# Patient Record
Sex: Female | Born: 1995 | Race: White | Hispanic: No | Marital: Single | Smoking: Never smoker
Health system: Southern US, Community
[De-identification: ages and names within clinical notes are randomized; demographics above are authoritative.]

## PROBLEM LIST (undated history)

## (undated) DIAGNOSIS — Z789 Other specified health status: Secondary | ICD-10-CM

## (undated) HISTORY — DX: Other specified health status: Z78.9

---
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
## 2016-08-08 ENCOUNTER — Ambulatory Visit (INDEPENDENT_AMBULATORY_CARE_PROVIDER_SITE_OTHER): Payer: BLUE CROSS/BLUE SHIELD | Admitting: Family Medicine

## 2016-08-08 VITALS — BP 109/69 | HR 74 | Temp 98.0°F | Resp 18 | Ht 66.0 in | Wt 142.1 lb

## 2016-08-08 DIAGNOSIS — L259 Unspecified contact dermatitis, unspecified cause: Secondary | ICD-10-CM | POA: Diagnosis not present

## 2016-08-08 DIAGNOSIS — B354 Tinea corporis: Secondary | ICD-10-CM

## 2016-08-08 MED ORDER — CLOTRIMAZOLE-BETAMETHASONE 1-0.05 % EX CREA: 1.0000 "application " | TOPICAL_CREAM | Freq: Two times a day (BID) | CUTANEOUS | 0 refills | Status: AC

## 2016-08-08 NOTE — Progress Notes (Signed)
Subjective:  By signing my name below, I, Janet Baker, attest that this documentation has been prepared under the direction and in the presence of Shade Flood, MD Electronically Signed: Charline Bills, ED Scribe 08/08/2016 at 12:40 PM.   Patient ID: Janet Baker, female    DOB: June 01, 1995, 21 y.o.   MRN: [id number]  Chief Complaint  Patient presents with  . Recurrent Skin Infections    possible ringworm on back of right leg (behind knee)-noticed about 4-5 weeks ago (may be getting bigger & spreading). No itching, just red   HPI Janet Baker is a 21 y.o. female who presents to Primary Care at [facility] complaining of a gradually spreading non-pruritic, nonpainful red rash to the back of her right leg first noticed approximately 4-5 weeks ago. Pt states that the rash has gradually spread to the left leg as well. No treatments tried PTA. She denies fever, night sweats and any other symptoms. 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   There are no active problems to display for this patient.  No past medical history on file. No past surgical history on file. No Known Allergies Prior to Admission medications   Not on File   Social History   Social History  . Marital status: Single    Spouse name: N/A  . Number of children: N/A  . Years of education: N/A   Occupational History  . Crew member [employer]   Social History Main Topics  . Smoking status: Never Smoker  . Smokeless tobacco: Never Used  . Alcohol use No  . Drug use: Unknown  . Sexual activity: No   Other Topics Concern  . Not on file   Social History Narrative   [employer] beginning in fall 2016 - undecided major.   Review of Systems  Constitutional: Negative for diaphoresis and fever.  Skin: Positive for rash.     Objective:   Physical Exam  Constitutional: She is oriented to person, place, and time. She appears well-developed and well-nourished. No distress.  HENT:   Head: Normocephalic and atraumatic.  Eyes: Conjunctivae and EOM are normal.  Neck: Neck supple. No tracheal deviation present.  Cardiovascular: Normal rate.   Pulmonary/Chest: Effort normal. No respiratory distress.  Musculoskeletal: Normal range of motion.  Neurological: She is alert and oriented to person, place, and time.  Skin: Skin is warm and dry. Rash noted.  L wrist: few scatted erythematous papules on volar aspect of wrist.  R LE: patch with slight central clearing dry erythematous appearing peripherally measuring 3 cm across. Small 1 cm patch proximal. ~1 cm patch on posterior R upper calf.  L LE: 2 small 1 cm patches. Very faint patch anterior L LE 2 cm across.   Psychiatric: She has a normal mood and affect. Her behavior is normal.  Nursing note and vitals reviewed.  Vitals:   08/08/16 1149  BP: 109/69  Pulse: 74  Resp: 18  Temp: 98 F (36.7 C)  TempSrc: Oral  SpO2: 99%  Weight: 142 lb 2 oz (64.5 kg)  Height:  (1.676 m)      Assessment & Plan:    BRINLEIGH TEW is a 21 y.o. female Contact dermatitis, unspecified contact dermatitis type, unspecified trigger  - Probable reaction to metal on watch for wrist. Avoid use of watch temporarily, or cover metal. Over-the-counter cortisone cream if needed.  Tinea corporis   - Few patches on lower legs, largest patch does have  some central clearing aspect that may be tinea corporis. Less likely granuloma annulare, or vasculitic.   -Trial of Lotrisone cream twice a day, if there is clearing, use for an additional 1 week.  - If rashes continue or persistent spread - recheck here or other medical provider.    Meds ordered this encounter  Medications  . clotrimazole-betamethasone (LOTRISONE) cream    Sig: Apply 1 application topically 2 (two) times daily.    Dispense:  45 g    Refill:  0   Patient Instructions   The rash on your wrist appears to be due to contact dermatitis, likely from the watch. You can try  avoiding use of watch temporarily and cortisone cream if needed. That can be purchased over-the-counter.  Rash on your legs may be due to ringworm or another type of contact dermatitis. Try the Lotrisone cream twice per day. If the symptoms are improving, continue to use that cream for time needed for rash to resolve, then for 1 additional week. If not improving the next 2-3 weeks or continuing to spread, recheck here or other medical provider.  Return to the clinic or go to the nearest emergency room if any of your symptoms worsen or new symptoms occur.   Body Ringworm Body ringworm is an infection of the skin that often causes a ring-shaped rash. Body ringworm can affect any part of your skin. It can spread easily to others. Body ringworm is also called tinea corporis. What are the causes? This condition is caused by funguses called dermatophytes. The condition develops when these funguses grow out of control on the skin. You can get this condition if you touch a person or animal that has it. You can also get it if you share clothing, bedding, towels, or any other object with an infected person or pet. What increases the risk? This condition is more likely to develop in:  Athletes who often make skin-to-skin contact with other athletes, such as wrestlers.  People who share equipment and mats.  People with a weakened immune system. What are the signs or symptoms? Symptoms of this condition include:  Itchy, raised red spots and bumps.  Red scaly patches.  A ring-shaped rash. The rash may have:  A clear center.  Scales or red bumps at its center.  Redness near its borders.  Dry and scaly skin on or around it. How is this diagnosed? This condition can usually be diagnosed with a skin exam. A skin scraping may be taken from the affected area and examined under a microscope to see if the fungus is present. How is this treated? This condition may be treated with:  An antifungal  cream or ointment.  An antifungal shampoo.  Antifungal medicines. These may be prescribed if your ringworm is severe, keeps coming back, or lasts a long time. Follow these instructions at home:  Take over-the-counter and prescription medicines only as told by your health care provider.  If you were given an antifungal cream or ointment:  Use it as told by your health care provider.  Wash the infected area and dry it completely before applying the cream or ointment.  If you were given an antifungal shampoo:  Use it as told by your health care provider.  Leave the shampoo on your body for 3-5 minutes before rinsing.  While you have a rash:  Wear loose clothing to stop clothes from rubbing and irritating it.  Wash or change your bed sheets every night.  If your  pet has the same infection, take your pet to see a International aid/development worker. How is this prevented?  Practice good hygiene.  Wear sandals or shoes in public places and showers.  Do not share personal items with others.  Avoid touching red patches of skin on other people.  Avoid touching pets that have bald spots.  If you touch an animal that has a bald spot, wash your hands. Contact a health care provider if:  Your rash continues to spread after 7 days of treatment.  Your rash is not gone in 4 weeks.  The area around your rash gets red, warm, tender, and swollen. This information is not intended to replace advice given to you by your health care provider. Make sure you discuss any questions you have with your health care provider. Document Released: 04/24/2000 Document Revised: 10/03/2015 Document Reviewed: 02/21/2015 Elsevier Interactive Patient Education  2017 Elsevier Inc.  Contact Dermatitis Dermatitis is redness, soreness, and swelling (inflammation) of the skin. Contact dermatitis is a reaction to certain substances that touch the skin. There are two types of contact dermatitis:  Irritant contact dermatitis. This  type is caused by something that irritates your skin, such as dry hands from washing them too much. This type does not require previous exposure to the substance for a reaction to occur. This type is more common.  Allergic contact dermatitis. This type is caused by a substance that you are allergic to, such as a nickel allergy or poison ivy. This type only occurs if you have been exposed to the substance (allergen) before. Upon a repeat exposure, your body reacts to the substance. This type is less common. What are the causes? Many different substances can cause contact dermatitis. Irritant contact dermatitis is most commonly caused by exposure to:  Makeup.  Soaps.  Detergents.  Bleaches.  Acids.  Metal salts, such as nickel. Allergic contact dermatitis is most commonly caused by exposure to:  Poisonous plants.  Chemicals.  Jewelry.  Latex.  Medicines.  Preservatives in products, such as clothing. What increases the risk? This condition is more likely to develop in:  People who have jobs that expose them to irritants or allergens.  People who have certain medical conditions, such as asthma or eczema. What are the signs or symptoms? Symptoms of this condition may occur anywhere on your body where the irritant has touched you or is touched by you. Symptoms include:  Dryness or flaking.  Redness.  Cracks.  Itching.  Pain or a burning feeling.  Blisters.  Drainage of small amounts of blood or clear fluid from skin cracks. With allergic contact dermatitis, there may also be swelling in areas such as the eyelids, mouth, or genitals. How is this diagnosed? This condition is diagnosed with a medical history and physical exam. A patch skin test may be performed to help determine the cause. If the condition is related to your job, you may need to see an occupational medicine specialist. How is this treated? Treatment for this condition includes figuring out what caused  the reaction and protecting your skin from further contact. Treatment may also include:  Steroid creams or ointments. Oral steroid medicines may be needed in more severe cases.  Antibiotics or antibacterial ointments, if a skin infection is present.  Antihistamine lotion or an antihistamine taken by mouth to ease itching.  A bandage (dressing). Follow these instructions at home: Skin Care   Moisturize your skin as needed.  Apply cool compresses to the affected areas.  Try taking  a bath with:  Epsom salts. Follow the instructions on the packaging. You can get these at your local pharmacy or grocery store.  Baking soda. Pour a small amount into the bath as directed by your health care provider.  Colloidal oatmeal. Follow the instructions on the packaging. You can get this at your local pharmacy or grocery store.  Try applying baking soda paste to your skin. Stir water into baking soda until it reaches a paste-like consistency.  Do not scratch your skin.  Bathe less frequently, such as every other day.  Bathe in lukewarm water. Avoid using hot water. Medicines   Take or apply over-the-counter and prescription medicines only as told by your health care provider.  If you were prescribed an antibiotic medicine, take or apply your antibiotic as told by your health care provider. Do not stop using the antibiotic even if your condition starts to improve. General instructions   Keep all follow-up visits as told by your health care provider. This is important.  Avoid the substance that caused your reaction. If you do not know what caused it, keep a journal to try to track what caused it. Write down:  What you eat.  What cosmetic products you use.  What you drink.  What you wear in the affected area. This includes jewelry.  If you were given a dressing, take care of it as told by your health care provider. This includes when to change and remove it. Contact a health care  provider if:  Your condition does not improve with treatment.  Your condition gets worse.  You have signs of infection such as swelling, tenderness, redness, soreness, or warmth in the affected area.  You have a fever.  You have new symptoms. Get help right away if:  You have a severe headache, neck pain, or neck stiffness.  You vomit.  You feel very sleepy.  You notice red streaks coming from the affected area.  Your bone or joint underneath the affected area becomes painful after the skin has healed.  The affected area turns darker.  You have difficulty breathing. This information is not intended to replace advice given to you by your health care provider. Make sure you discuss any questions you have with your health care provider. Document Released: 04/24/2000 Document Revised: 10/03/2015 Document Reviewed: 09/12/2014 Elsevier Interactive Patient Education  2017 [employer].   IF you received an x-ray today, you will receive an invoice from [facility]. Please contact [facility] at [phone] with questions or concerns regarding your invoice.   IF you received labwork today, you will receive an invoice from [place]. Please contact [facility] at [phone] with questions or concerns regarding your invoice.   Our billing staff will not be able to assist you with questions regarding bills from these companies.  You will be contacted with the lab results as soon as they are available. The fastest way to get your results is to activate your My Chart account. Instructions are located on the last page of this paperwork. If you have not heard from [place] regarding the results in 2 weeks, please contact this office.       I personally performed the services described in this documentation, which was scribed in my presence. The recorded information has been reviewed and considered for accuracy and completeness, addended by me as needed, and agree with  information above.  Signed,   Meredith Staggers, MD Primary Care at [facility].  08/08/16 1:44 PM

## 2016-08-08 NOTE — Patient Instructions (Addendum)
The rash on your wrist appears to be due to contact dermatitis, likely from the watch. You can try avoiding use of watch temporarily and cortisone cream if needed. That can be purchased over-the-counter.  Rash on your legs may be due to ringworm or another type of contact dermatitis. Try the Lotrisone cream twice per day. If the symptoms are improving, continue to use that cream for time needed for rash to resolve, then for 1 additional week. If not improving the next 2-3 weeks or continuing to spread, recheck here or other medical provider.  Return to the clinic or go to the nearest emergency room if any of your symptoms worsen or new symptoms occur.   Body Ringworm Body ringworm is an infection of the skin that often causes a ring-shaped rash. Body ringworm can affect any part of your skin. It can spread easily to others. Body ringworm is also called tinea corporis. What are the causes? This condition is caused by funguses called dermatophytes. The condition develops when these funguses grow out of control on the skin. You can get this condition if you touch a person or animal that has it. You can also get it if you share clothing, bedding, towels, or any other object with an infected person or pet. What increases the risk? This condition is more likely to develop in:  Athletes who often make skin-to-skin contact with other athletes, such as wrestlers.  People who share equipment and mats.  People with a weakened immune system. What are the signs or symptoms? Symptoms of this condition include:  Itchy, raised red spots and bumps.  Red scaly patches.  A ring-shaped rash. The rash may have:  A clear center.  Scales or red bumps at its center.  Redness near its borders.  Dry and scaly skin on or around it. How is this diagnosed? This condition can usually be diagnosed with a skin exam. A skin scraping may be taken from the affected area and examined under a microscope to see if  the fungus is present. How is this treated? This condition may be treated with:  An antifungal cream or ointment.  An antifungal shampoo.  Antifungal medicines. These may be prescribed if your ringworm is severe, keeps coming back, or lasts a long time. Follow these instructions at home:  Take over-the-counter and prescription medicines only as told by your health care provider.  If you were given an antifungal cream or ointment:  Use it as told by your health care provider.  Wash the infected area and dry it completely before applying the cream or ointment.  If you were given an antifungal shampoo:  Use it as told by your health care provider.  Leave the shampoo on your body for 3-5 minutes before rinsing.  While you have a rash:  Wear loose clothing to stop clothes from rubbing and irritating it.  Wash or change your bed sheets every night.  If your pet has the same infection, take your pet to see a International aid/development worker. How is this prevented?  Practice good hygiene.  Wear sandals or shoes in public places and showers.  Do not share personal items with others.  Avoid touching red patches of skin on other people.  Avoid touching pets that have bald spots.  If you touch an animal that has a bald spot, wash your hands. Contact a health care provider if:  Your rash continues to spread after 7 days of treatment.  Your rash is not gone in 4  weeks.  The area around your rash gets red, warm, tender, and swollen. This information is not intended to replace advice given to you by your health care provider. Make sure you discuss any questions you have with your health care provider. Document Released: 04/24/2000 Document Revised: 10/03/2015 Document Reviewed: 02/21/2015 Elsevier Interactive Patient Education  2017 Elsevier Inc.  Contact Dermatitis Dermatitis is redness, soreness, and swelling (inflammation) of the skin. Contact dermatitis is a reaction to certain substances  that touch the skin. There are two types of contact dermatitis:  Irritant contact dermatitis. This type is caused by something that irritates your skin, such as dry hands from washing them too much. This type does not require previous exposure to the substance for a reaction to occur. This type is more common.  Allergic contact dermatitis. This type is caused by a substance that you are allergic to, such as a nickel allergy or poison ivy. This type only occurs if you have been exposed to the substance (allergen) before. Upon a repeat exposure, your body reacts to the substance. This type is less common. What are the causes? Many different substances can cause contact dermatitis. Irritant contact dermatitis is most commonly caused by exposure to:  Makeup.  Soaps.  Detergents.  Bleaches.  Acids.  Metal salts, such as nickel. Allergic contact dermatitis is most commonly caused by exposure to:  Poisonous plants.  Chemicals.  Jewelry.  Latex.  Medicines.  Preservatives in products, such as clothing. What increases the risk? This condition is more likely to develop in:  People who have jobs that expose them to irritants or allergens.  People who have certain medical conditions, such as asthma or eczema. What are the signs or symptoms? Symptoms of this condition may occur anywhere on your body where the irritant has touched you or is touched by you. Symptoms include:  Dryness or flaking.  Redness.  Cracks.  Itching.  Pain or a burning feeling.  Blisters.  Drainage of small amounts of blood or clear fluid from skin cracks. With allergic contact dermatitis, there may also be swelling in areas such as the eyelids, mouth, or genitals. How is this diagnosed? This condition is diagnosed with a medical history and physical exam. A patch skin test may be performed to help determine the cause. If the condition is related to your job, you may need to see an occupational  medicine specialist. How is this treated? Treatment for this condition includes figuring out what caused the reaction and protecting your skin from further contact. Treatment may also include:  Steroid creams or ointments. Oral steroid medicines may be needed in more severe cases.  Antibiotics or antibacterial ointments, if a skin infection is present.  Antihistamine lotion or an antihistamine taken by mouth to ease itching.  A bandage (dressing). Follow these instructions at home: Skin Care   Moisturize your skin as needed.  Apply cool compresses to the affected areas.  Try taking a bath with:  Epsom salts. Follow the instructions on the packaging. You can get these at your local pharmacy or grocery store.  Baking soda. Pour a small amount into the bath as directed by your health care provider.  Colloidal oatmeal. Follow the instructions on the packaging. You can get this at your local pharmacy or grocery store.  Try applying baking soda paste to your skin. Stir water into baking soda until it reaches a paste-like consistency.  Do not scratch your skin.  Bathe less frequently, such as every other day.  Bathe in lukewarm water. Avoid using hot water. Medicines   Take or apply over-the-counter and prescription medicines only as told by your health care provider.  If you were prescribed an antibiotic medicine, take or apply your antibiotic as told by your health care provider. Do not stop using the antibiotic even if your condition starts to improve. General instructions   Keep all follow-up visits as told by your health care provider. This is important.  Avoid the substance that caused your reaction. If you do not know what caused it, keep a journal to try to track what caused it. Write down:  What you eat.  What cosmetic products you use.  What you drink.  What you wear in the affected area. This includes jewelry.  If you were given a dressing, take care of it as  told by your health care provider. This includes when to change and remove it. Contact a health care provider if:  Your condition does not improve with treatment.  Your condition gets worse.  You have signs of infection such as swelling, tenderness, redness, soreness, or warmth in the affected area.  You have a fever.  You have new symptoms. Get help right away if:  You have a severe headache, neck pain, or neck stiffness.  You vomit.  You feel very sleepy.  You notice red streaks coming from the affected area.  Your bone or joint underneath the affected area becomes painful after the skin has healed.  The affected area turns darker.  You have difficulty breathing. This information is not intended to replace advice given to you by your health care provider. Make sure you discuss any questions you have with your health care provider. Document Released: 04/24/2000 Document Revised: 10/03/2015 Document Reviewed: 09/12/2014 Elsevier Interactive Patient Education  2017 [employer].   IF you received an x-ray today, you will receive an invoice from [facility]. Please contact [facility] at [phone] with questions or concerns regarding your invoice.   IF you received labwork today, you will receive an invoice from [place]. Please contact [facility] at [phone] with questions or concerns regarding your invoice.   Our billing staff will not be able to assist you with questions regarding bills from these companies.  You will be contacted with the lab results as soon as they are available. The fastest way to get your results is to activate your My Chart account. Instructions are located on the last page of this paperwork. If you have not heard from [place] regarding the results in 2 weeks, please contact this office.

## 2016-10-30 ENCOUNTER — Encounter: Payer: Self-pay | Admitting: Family Medicine

## 2016-10-30 ENCOUNTER — Ambulatory Visit (INDEPENDENT_AMBULATORY_CARE_PROVIDER_SITE_OTHER): Payer: BLUE CROSS/BLUE SHIELD | Admitting: Family Medicine

## 2016-10-30 VITALS — BP 107/70 | HR 83 | Temp 98.0°F | Resp 18 | Ht 65.75 in | Wt 145.0 lb

## 2016-10-30 DIAGNOSIS — Z23 Encounter for immunization: Secondary | ICD-10-CM | POA: Diagnosis not present

## 2016-10-30 DIAGNOSIS — Z7189 Other specified counseling: Secondary | ICD-10-CM

## 2016-10-30 DIAGNOSIS — Z7689 Persons encountering health services in other specified circumstances: Secondary | ICD-10-CM | POA: Diagnosis not present

## 2016-10-30 DIAGNOSIS — Z7184 Encounter for health counseling related to travel: Secondary | ICD-10-CM

## 2016-10-30 MED ORDER — MENINGOCOCCAL VAC B (OMV) IM SUSY: 0.5000 mL | PREFILLED_SYRINGE | Freq: Once | INTRAMUSCULAR | 0 refills | Status: AC

## 2016-10-30 NOTE — Progress Notes (Signed)
Subjective:  By signing my name below, I, Janet Baker, attest that this documentation has been prepared under the direction and in the presence of Wendie Agreste, MD Electronically Signed: Ladene Artist, ED Scribe 10/30/2016 at 10:29 AM.   Patient ID: Janet Baker, female    DOB: 06-06-1995, 21 y.o.   MRN: [id number]  Chief Complaint  Patient presents with  . Immunizations   HPI Janet Baker is a 21 y.o. female who presents to Primary Care at [facility] for immunization review. Most recent Tdap May 2016; requests booster. Pt states that she called and was told that she needed to come in to have a booster done since her last Tdap was in 2007. She is unsure who she spoke with on the phone. Pt also requests the Meningitis B vaccine at this visit since she plans to travel to [place] for 3 months in September for school. Her pediatrician recommended that she get the vaccine since Meningitis B originated in [place]. Pt lives in an apartment with roommates.   Immunization History  Administered Date(s) Administered  . DTaP 02/02/1997, 04/04/1997, 06/04/1997, 06/18/1998, 12/03/2000  . Hepatitis A 12/07/2005, 12/08/2006  . Hepatitis B Jun 09, 1995, 01/02/1997, 09/03/1997  . HiB (PRP-OMP) 02/02/1997, 04/04/1997, 01/23/1998  . IPV 02/02/1997, 04/04/1997, 06/04/1997, 12/03/2000  . Influenza-Unspecified 03/14/2009  . MMR 01/23/1998, 12/03/2000  . Meningococcal Conjugate 09/22/2013  . Meningococcal Polysaccharide 01/08/2009  . Td 11/26/2005  . Tdap 09/11/2014  . Varicella 01/23/1998, 12/07/2005   There are no active problems to display for this patient.  History reviewed. No pertinent past medical history. History reviewed. No pertinent surgical history. No Known Allergies Prior to Admission medications   Medication Sig Start Date End Date Taking? Authorizing Provider  clotrimazole-betamethasone (LOTRISONE) cream Apply 1 application topically 2 (two) times daily. 08/08/16   Wendie Agreste,  MD   Social History   Social History  . Marital status: Single    Spouse name: N/A  . Number of children: N/A  . Years of education: N/A   Occupational History  . Crew member [employer]   Social History Main Topics  . Smoking status: Never Smoker  . Smokeless tobacco: Never Used  . Alcohol use No  . Drug use: Unknown  . Sexual activity: No   Other Topics Concern  . Not on file   Social History Narrative   [employer] beginning in fall 2016 - undecided major.   Review of Systems  Constitutional: Negative for fever.      Objective:   Physical Exam  Constitutional: She is oriented to person, place, and time. She appears well-developed and well-nourished. No distress.  HENT:  Head: Normocephalic and atraumatic.  Eyes: Conjunctivae and EOM are normal.  Neck: Neck supple. No tracheal deviation present.  Cardiovascular: Normal rate.   Pulmonary/Chest: Effort normal. No respiratory distress.  Musculoskeletal: Normal range of motion.  Neurological: She is alert and oriented to person, place, and time.  Skin: Skin is warm and dry.  Psychiatric: She has a normal mood and affect. Her behavior is normal.  Nursing note and vitals reviewed.  Vitals:   10/30/16 1022  BP: 107/70  Pulse: 83  Resp: 18  Temp: 98 F (36.7 C)  TempSrc: Oral  SpO2: 98%  Weight: 145 lb (65.8 kg)  Height: 5' 5.75" (1.67 m)   Over 15 minute visit with greater than 50% counseling.     Assessment & Plan:   Janet Baker is a 21 y.o. female Immunizations reviewed  and up to date  Need for meningitis vaccination - Plan: meningococcal B (BEXSERO) SUSY injection  Counseling about travel  Immunizations reviewed, travel counseling discussed for [place] trip later this year. Appears to be up-to-date on all immunizations needed for travel. Meningococcal B vaccination discussed as well as typical recommendations. Link to ACIP given. Did prescribe Bexsera if she chooses to receive that vaccine to  have filled at her local pharmacy. Meds ordered this encounter  Medications  . meningococcal B (BEXSERO) SUSY injection    Sig: Inject 0.5 mLs into the muscle once.    Dispense:  0.5 mL    Refill:  0   Patient Instructions   For travel recommendations and vaccines for trip to [place], go to http://trevino.[email or web address]: [email or web address]  I will write for Bexsero, but look into that medication further to see if you would like to have it given.  [email or web address]    IF you received an x-ray today, you will receive an invoice from [facility]. Please contact [facility] at [phone] with questions or concerns regarding your invoice.   IF you received labwork today, you will receive an invoice from [facility]. Please contact [facility] at [phone] with questions or concerns regarding your invoice.   Our billing staff will not be able to assist you with questions regarding bills from these companies.  You will be contacted with the lab results as soon as they are available. The fastest way to get your results is to activate your My Chart account. Instructions are located on the last page of this paperwork. If you have not heard from [place] regarding the results in 2 weeks, please contact this office.       I personally performed the services described in this documentation, which was scribed in my presence. The recorded information has been reviewed and considered for accuracy and completeness, addended by me as needed, and agree with information above.  Signed,   Merri Ray, MD Primary Care at [place].  10/30/16 1:32 PM

## 2016-10-30 NOTE — Patient Instructions (Addendum)
For travel recommendations and vaccines for trip to [place], go to DiningCalendar.deCDC.gov: DialForum.co.ukhttps://wwwnc.cdc.gov/travel/destinations/list/  I will write for Bexsero, but look into that medication further to see if you would like to have it given.  http://cameron-[email or web address]/Https://www.cdc.gov/vaccines/acip/index.html    IF you received an x-ray today, you will receive an invoice from [facility]. Please contact [facility] at [phone] with questions or concerns regarding your invoice.   IF you received labwork today, you will receive an invoice from [facility]. Please contact [facility] at [phone] with questions or concerns regarding your invoice.   Our billing staff will not be able to assist you with questions regarding bills from these companies.  You will be contacted with the lab results as soon as they are available. The fastest way to get your results is to activate your My Chart account. Instructions are located on the last page of this paperwork. If you have not heard from us regarding the results in 2 weeks, please contact this office.

## 2016-12-05 IMAGING — DX DG CHEST 2V
2 series · 2 of 2 positions shown · non-contrast
Comparison: None.

CLINICAL DATA: Cough with intermittent fever and chills since
02/15/2015. Initial encounter.

EXAM:
CHEST  2 VIEW

[chest pa]
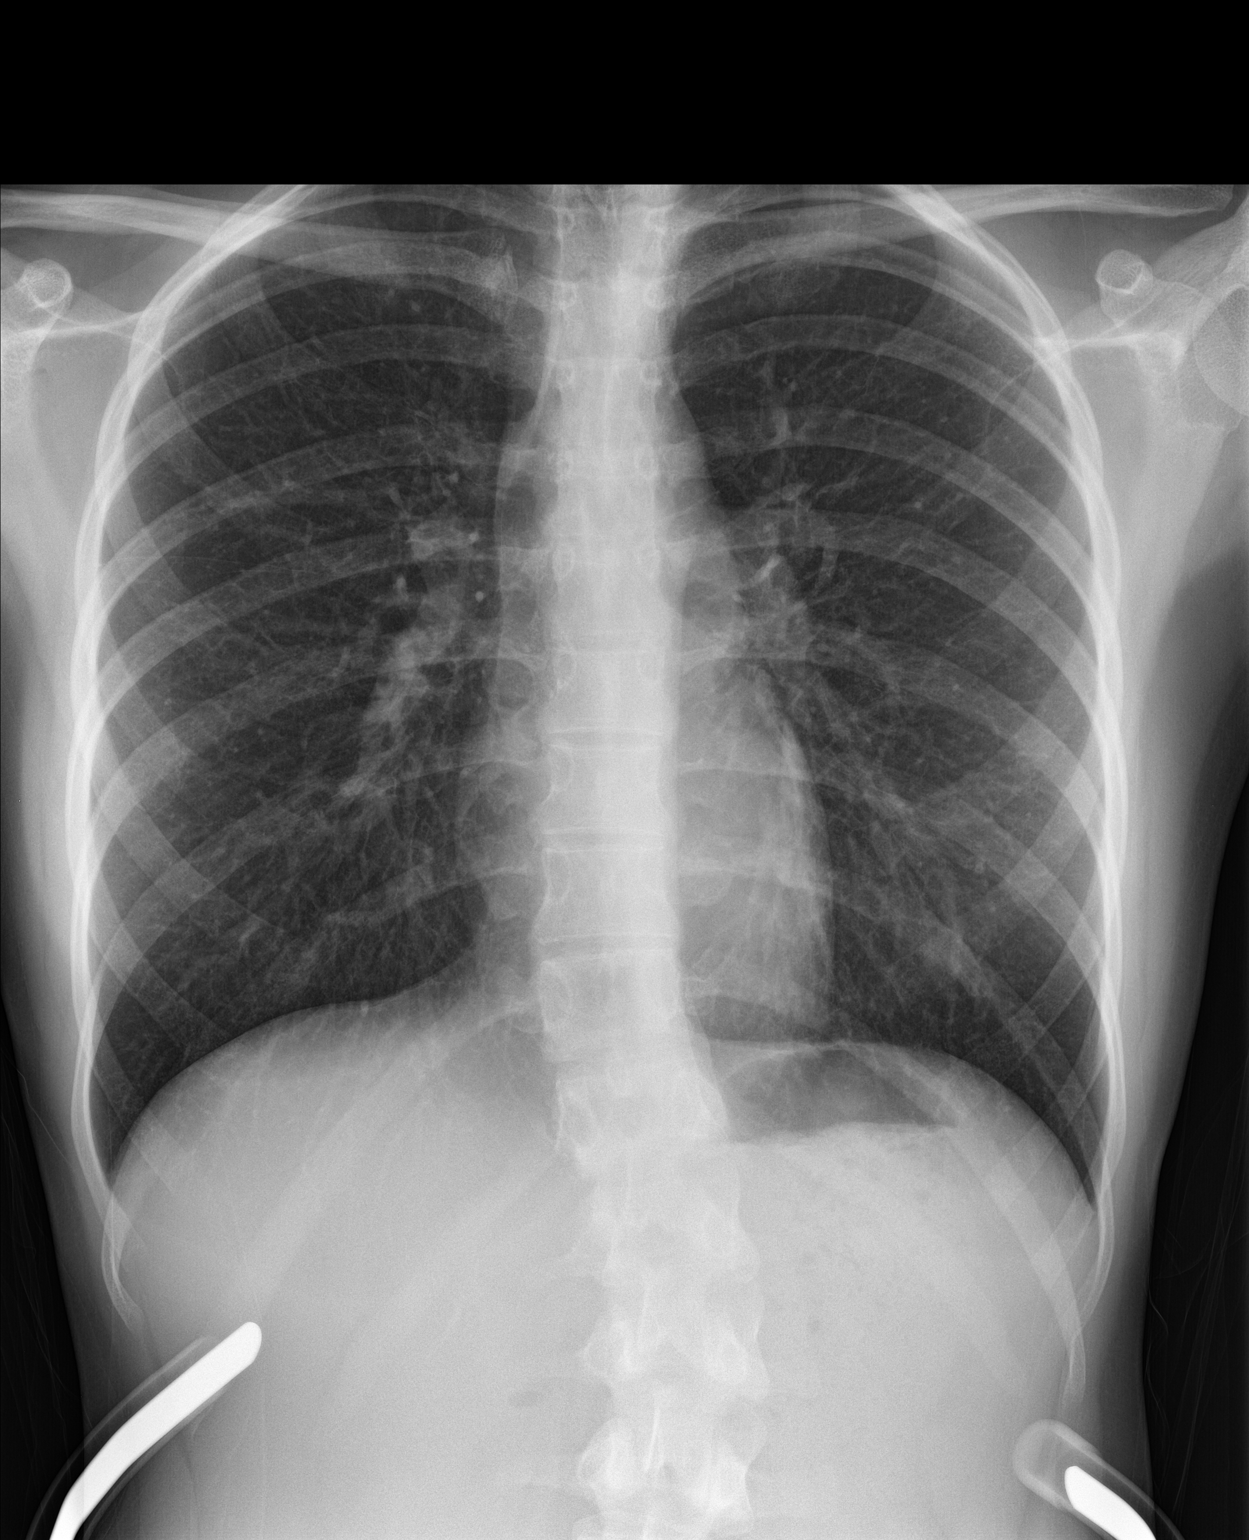

[chest lat]
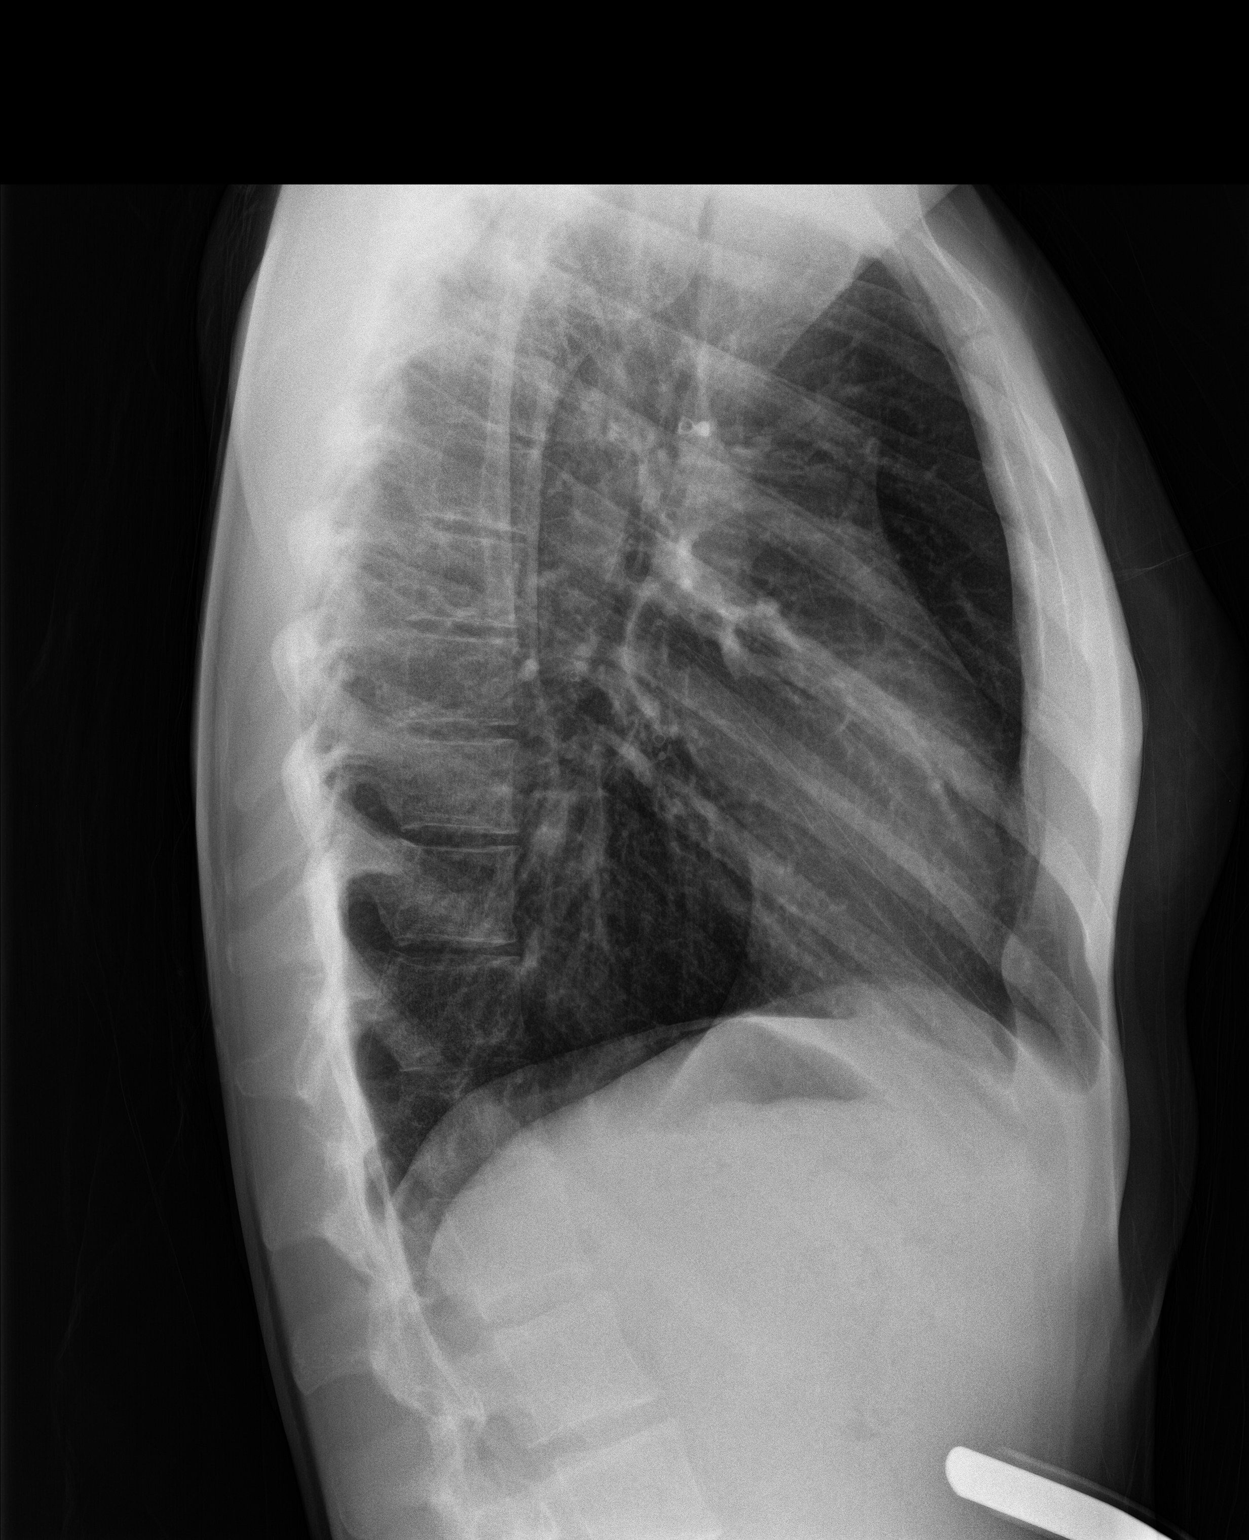

[2 of 2 positions shown; findings below may reference images not displayed]

FINDINGS: The lungs are clear. Heart size is normal. No pneumothorax or
pleural effusion. Convex left lumbar scoliosis is noted.
IMPRESSION: No acute disease.

## 2018-01-26 ENCOUNTER — Encounter (INDEPENDENT_AMBULATORY_CARE_PROVIDER_SITE_OTHER): Payer: Self-pay

## 2018-01-26 ENCOUNTER — Ambulatory Visit (INDEPENDENT_AMBULATORY_CARE_PROVIDER_SITE_OTHER): Payer: BC Managed Care – PPO | Admitting: Family Medicine

## 2018-01-26 VITALS — BP 114/71 | HR 72 | Temp 98.7°F | Resp 18 | Ht 66.0 in | Wt 140.0 lb

## 2018-01-26 DIAGNOSIS — H1032 Unspecified acute conjunctivitis, left eye: Secondary | ICD-10-CM

## 2018-01-26 MED ORDER — TOBRAMYCIN 0.3 % OP SOLN
2.00 [drp] | Freq: Four times a day (QID) | OPHTHALMIC | 0 refills | Status: AC
Start: 2018-01-26 — End: 2018-01-31

## 2018-01-26 NOTE — Progress Notes (Signed)
Subjective:    Patient ID: Leah Larson is a 22 y.o. female.    Conjunctivitis    The current episode started today. The onset was gradual. The problem has been unchanged. The problem is moderate. Nothing relieves the symptoms. Associated symptoms include eye itching, eye discharge and eye redness. Pertinent negatives include no fever, no decreased vision, no double vision, no photophobia, no vomiting, no congestion, no ear discharge, no ear pain, no headaches, no hearing loss, no mouth sores, no rhinorrhea, no sore throat, no stridor, no swollen glands, no cough, no URI, no rash and no eye pain. There were sick contacts at school.       The following portions of the patient's history were reviewed and updated as appropriate: allergies, current medications, past family history, past medical history, past social history, past surgical history and problem list.    Review of Systems   Constitutional: Negative for activity change, appetite change, chills, diaphoresis, fatigue and fever.   HENT: Negative for congestion, ear discharge, ear pain, hearing loss, mouth sores, rhinorrhea and sore throat.    Eyes: Positive for discharge, redness and itching. Negative for double vision, photophobia and pain.   Respiratory: Negative for cough and stridor.    Gastrointestinal: Negative for vomiting.   Skin: Negative for rash.   Neurological: Negative for headaches.         Objective:    BP 114/71   Pulse 72   Temp 98.7 F (37.1 C) (Oral)   Resp 18   Ht 1.676 m (5\' 6" )   Wt 63.5 kg (140 lb)   LMP 12/11/2017 (Approximate)   BMI 22.60 kg/m     Physical Exam   Constitutional: She is oriented to person, place, and time. She appears well-developed and well-nourished. No distress.   HENT:   Head: Normocephalic and atraumatic.   Mouth/Throat: Oropharynx is clear and moist. No oropharyngeal exudate.   Eyes: Pupils are equal, round, and reactive to light. EOM are normal. Right eye exhibits no discharge and no exudate. Left  eye exhibits no discharge and no exudate. Right conjunctiva is not injected. Left conjunctiva is injected.   Neck: Normal range of motion. Neck supple.   Cardiovascular: Normal rate, regular rhythm and normal heart sounds.    No murmur heard.  Pulmonary/Chest: Effort normal and breath sounds normal. No respiratory distress. She has no wheezes. She has no rales.   Musculoskeletal: She exhibits no edema.   Lymphadenopathy:     She has no cervical adenopathy.   Neurological: She is alert and oriented to person, place, and time.   Skin: Skin is warm. No rash noted. She is not diaphoretic.   Psychiatric: She has a normal mood and affect.         Assessment and Plan:       Joretta was seen today for conjunctivitis.    Diagnoses and all orders for this visit:    Acute conjunctivitis of left eye, unspecified acute conjunctivitis type  -     tobramycin (TOBREX) 0.3 % ophthalmic solution; Place 2 drops into the left eye every 6 (six) hours for 5 days        F/u with Opthalmology if not better in 2-3 days    F/u prn      Etheleen Sia, MD  [facility]  01/26/2018  5:47 PM

## 2018-01-26 NOTE — Patient Instructions (Signed)
Conjunctivitis, Nonspecific    The membrane that coversthe white part ofyour eye (the conjunctiva) is inflamed. Inflammation happens when your body responds to an injury, allergic reaction, infection, or illness. Symptoms of inflammation in the eye may include redness, irritation, itching, swelling, or burning. These symptoms should go away within the next 24 hours. Conjunctivitis may be related to a particle that was in your eye. If so, itmay washout with your tears or irrigation treatment. Being exposed to liquid chemicals or fumes may also cause this reaction.  Home care   Apply a cold pack over the eye for 20 minutes at a time. This will reduce pain. To make a cold pack, put ice cubes in a plastic bag that seals at the top. Wrap the bag in a clean, thin towel or cloth.   Artificial tears may be prescribed to reduce irritation or redness. These should be used 3 to 4 times a day.   You may use acetaminophen or ibuprofen to control pain, unless another medicine was prescribed. (Note: If you have chronic liver or kidney disease, or if you have ever had a stomach ulcer or gastrointestinal bleeding, talk with your healthcare provider before using these medicines.)  Follow-up care  Follow up with your healthcare provider, or as advised.  When to seek medical advice  Call your healthcare provider right away if any of these occur:   Increased eyelid swelling   Increased eye pain   Increased redness or drainage from the eye   Increased blurry vision or increased sensitivity to light   Failure of normal vision to return within 24 to 48 hours  Date Last Reviewed: 11/09/2015   2000-2019 [employer] [address], [place] [address]. All rights reserved. This information is not intended as a substitute for professional medical care. Always follow your healthcare professional's instructions.

## 2018-01-29 ENCOUNTER — Telehealth (INDEPENDENT_AMBULATORY_CARE_PROVIDER_SITE_OTHER): Payer: Self-pay

## 2018-01-29 NOTE — Telephone Encounter (Signed)
Called to check on patient after recent visit. Unable to leave a message.

## 2018-02-10 ENCOUNTER — Ambulatory Visit (INDEPENDENT_AMBULATORY_CARE_PROVIDER_SITE_OTHER): Payer: BC Managed Care – PPO | Admitting: Physician Assistant

## 2018-02-10 ENCOUNTER — Other Ambulatory Visit
Admission: RE | Admit: 2018-02-10 | Discharge: 2018-02-10 | Disposition: A | Discharge status: Home / Self Care | Payer: Self-pay | Source: Ambulatory Visit | Attending: Physician Assistant | Admitting: Physician Assistant

## 2018-02-10 ENCOUNTER — Encounter (INDEPENDENT_AMBULATORY_CARE_PROVIDER_SITE_OTHER): Payer: Self-pay | Admitting: Physician Assistant

## 2018-02-10 VITALS — BP 100/56 | HR 74 | Temp 97.3°F | Resp 18 | Ht 66.0 in | Wt 140.0 lb

## 2018-02-10 DIAGNOSIS — R238 Other skin changes: Secondary | ICD-10-CM

## 2018-02-10 MED ORDER — VALACYCLOVIR HCL 1 G PO TABS
1000.00 mg | ORAL_TABLET | Freq: Two times a day (BID) | ORAL | 0 refills | Status: AC
Start: 2018-02-10 — End: 2018-02-17

## 2018-02-10 MED ORDER — CEPHALEXIN 500 MG PO CAPS
500.00 mg | ORAL_CAPSULE | Freq: Four times a day (QID) | ORAL | 0 refills | Status: AC
Start: 2018-02-10 — End: 2018-02-20

## 2018-02-10 NOTE — Patient Instructions (Signed)
The Herpes Virus  Herpes is a virus that can cause sores on the skin. There are 2 types of the virus. Depending on how you come in contact with the virus, either type can cause outbreaks near the mouth or on the sex organs.  Understanding the herpes virus  Herpes reproduces only when it is inside the body. It does so by tricking a healthy cell into producing copies of the herpes virus. Each copy can infect nearby cells. But, before too long, the body's defenses rally to stop the attack. The immune system forces the virus to retreat.Even then, the virus stays inside the body but does not cause disease. For some people, an acute outbreak never happens again. For others, outbreaks are more likely to occur due to menstruation, illness, poor diet, fatigue,exposure to cold or strong sunlight,or stress.    How the herpes virus attacks  1. The herpes virus enters the body through a small break in the skin. The virus can also enter by direct contact with mucous membranes, such as those of the lips, vagina, or anus.  2. Inside the body, the herpes virus binds to a special site on a skin cell. Then part of the virus moves into the cell.  3. Inside the skin cell, the virus releases a set of instructions. These commands cause the cell to begin making copies of the herpes virus.  4. Herpes blisters appear on the skin. Herpes blisters may also appear on mucous membranes lining the mouth, vagina, or anus.    Date Last Reviewed: 05/12/2015   2000-2019 [employer] [address], [place] [address]. All rights reserved. This information is not intended as a substitute for professional medical care. Always follow your healthcare professional's instructions.

## 2018-02-10 NOTE — Progress Notes (Signed)
Subjective:    Patient ID: Leah Larson is a 22 y.o. female.    Symptoms began three days ago at the left side of nose.  Rash has spread across nose and over a portion of the face.   Pt feels that the rash is behind right ear.   Pt has stopped wearing all makeup with no relief of symptoms   Rash is continuing to develop     Rash   This is a new problem. The current episode started in the past 7 days. The problem has been gradually worsening since onset. The affected locations include the face and scalp. The rash is characterized by itchiness and draining. It is unknown if there was an exposure to a precipitant. Pertinent negatives include no congestion, cough, diarrhea, eye pain, facial edema, fatigue, fever, rhinorrhea, shortness of breath, sore throat or vomiting. Past treatments include nothing. There is no history of allergies, asthma or eczema.       The following portions of the patient's history were reviewed and updated as appropriate: allergies, current medications, past family history, past medical history, past social history, past surgical history and problem list.    Review of Systems   Constitutional: Negative for activity change, chills, fatigue and fever.   HENT: Negative for congestion, rhinorrhea and sore throat.    Eyes: Negative for pain.   Respiratory: Negative for cough and shortness of breath.    Gastrointestinal: Negative for diarrhea and vomiting.   Skin: Positive for rash.   Allergic/Immunologic: Negative for environmental allergies and food allergies.         Objective:    BP 100/56   Pulse 74   Temp 97.3 F (36.3 C) (Oral)   Resp 18   Ht 1.676 m (5\' 6" )   Wt 63.5 kg (140 lb)   LMP 01/11/2018 (Approximate)   BMI 22.60 kg/m     Physical Exam  Vitals signs and nursing note reviewed.   Constitutional:       General: She is not in acute distress.     Appearance: She is well-developed. She is not diaphoretic.   HENT:      Head: Normocephalic.        Comments: Rash present  around nares shows excoriations and lesions that have been scratched open. Slight golden color on right nare wound.      Nose:     Eyes:      General: Lids are normal. Vision grossly intact.         Right eye: No discharge.         Left eye: No discharge.      Extraocular Movements: Extraocular movements intact.      Conjunctiva/sclera: Conjunctivae normal.      Right eye: Right conjunctiva is not injected. No exudate.     Left eye: Left conjunctiva is not injected. No exudate.    Cardiovascular:      Rate and Rhythm: Normal rate.   Pulmonary:      Effort: Pulmonary effort is normal.   Lymphadenopathy:      Cervical: No cervical adenopathy.   Skin:     General: Skin is warm and dry.      Findings: Erythema, rash and wound present. Rash is papular and vesicular.      Comments: Areas of rash are outlined in red. Vesicles are 1-59mm in diameter.    Neurological:      Mental Status: She is alert and oriented to person, place, and  time.           Assessment and Plan:       Leah Larson was seen today for rash.    Diagnoses and all orders for this visit:    Vesicular rash  -     cephalexin (KEFLEX) 500 MG capsule; Take 1 capsule (500 mg total) by mouth 4 (four) times daily for 10 days  -     valacyclovir (VALTREX) 1000 MG tablet; Take 1 tablet (1,000 mg total) by mouth 2 (two) times daily for 7 days  -     Wound Culture and Smear; Future  -     Varicella Zoster (VZV) Virus, PCR; Future  -     Fluid/Tissue Herpes Simplex Virus 1 and 2 Qual PCR; Future    Treat excoriation wounds on bilateral nares with OTC bacitracin TID until resolved.   Will send both wound culture, zoster PCR and Herpes tissue/fluid PCR.     Try not to scratch or dig at as this can cause secondary skin infection. Reviewed s/s of secondary infection that require re-evaluation.   Will contact pt if viral culture/s are positive or wound culture shows bacteria not covered by keflex.     Follow up with PCP or RTC if there are any new or worsening symptoms or if  the symptoms are lasting longer than expected.  Patient/guardian expressed understanding and agreement with plan of care at time of discharge.        Patient Instructions     The Herpes Virus  Herpes is a virus that can cause sores on the skin. There are 2 types of the virus. Depending on how you come in contact with the virus, either type can cause outbreaks near the mouth or on the sex organs.  Understanding the herpes virus  Herpes reproduces only when it is inside the body. It does so by tricking a healthy cell into producing copies of the herpes virus. Each copy can infect nearby cells. But, before too long, the body's defenses rally to stop the attack. The immune system forces the virus to retreat.Even then, the virus stays inside the body but does not cause disease. For some people, an acute outbreak never happens again. For others, outbreaks are more likely to occur due to menstruation, illness, poor diet, fatigue,exposure to cold or strong sunlight,or stress.    How the herpes virus attacks  1. The herpes virus enters the body through a small break in the skin. The virus can also enter by direct contact with mucous membranes, such as those of the lips, vagina, or anus.  2. Inside the body, the herpes virus binds to a special site on a skin cell. Then part of the virus moves into the cell.  3. Inside the skin cell, the virus releases a set of instructions. These commands cause the cell to begin making copies of the herpes virus.  4. Herpes blisters appear on the skin. Herpes blisters may also appear on mucous membranes lining the mouth, vagina, or anus.    Date Last Reviewed: 05/12/2015   2000-2019 [employer] [place]. [address] [place], [place] [address]. All rights reserved. This information is not intended as a substitute for professional medical care. Always follow your healthcare professional's instructions.            Grier Mitts, PA  [facility]  02/11/2018  10:08 AM

## 2018-02-10 NOTE — Progress Notes (Signed)
Throat culture sent to [facility] lab   hsv pcr and varicella swab sent to [facility]

## 2018-02-12 LAB — VH FLUID/TISSUE HERPES SIMPLEX VIRUS 1 AND 2 QUAL PCR - HLAB
HSV 1 PCR: NEGATIVE
HSV 2 PCR: NEGATIVE

## 2018-02-14 LAB — VH VARICELLA ZOSTER (VZV) VIRUS, PCR: Varicella-Zoster Virus PCR: NEGATIVE

## 2018-06-16 ENCOUNTER — Ambulatory Visit (INDEPENDENT_AMBULATORY_CARE_PROVIDER_SITE_OTHER): Payer: BC Managed Care – PPO | Admitting: Family Medicine

## 2018-06-16 ENCOUNTER — Encounter (INDEPENDENT_AMBULATORY_CARE_PROVIDER_SITE_OTHER): Payer: Self-pay | Admitting: Family Medicine

## 2018-06-16 VITALS — BP 126/71 | HR 79 | Temp 97.3°F | Resp 20 | Ht 66.0 in | Wt 135.0 lb

## 2018-06-16 DIAGNOSIS — J0100 Acute maxillary sinusitis, unspecified: Secondary | ICD-10-CM

## 2018-06-16 MED ORDER — FLUTICASONE PROPIONATE 50 MCG/ACT NA SUSP
2.00 | Freq: Every day | NASAL | 0 refills | Status: DC
Start: 2018-06-16 — End: 2018-12-19

## 2018-06-16 MED ORDER — AMOXICILLIN-POT CLAVULANATE 875-125 MG PO TABS
1.00 | ORAL_TABLET | Freq: Two times a day (BID) | ORAL | 0 refills | Status: AC
Start: 2018-06-16 — End: 2018-06-23

## 2018-06-16 NOTE — Patient Instructions (Signed)
Sinusitis (Antibiotic Treatment)    The sinuses are air-filled spaces within the bones of the face. They connect to the inside of the nose. Sinusitis is an inflammation of the tissue that lines the sinuses. Sinusitis can occur during a cold. It can also happen due to allergies to pollens and other particles in the air. Sinusitis can cause symptoms of sinus congestion and a feeling of fullness. A sinus infection causes fever, headache, and facial pain. There is often green or yellow fluid draining from the nose or into the back of the throat (post-nasal drip). You have been given antibiotics to treat this condition.  Home care  · Take the full course of antibiotics as instructed. Do not stop taking them, even when you feel better.  · Drink plenty of water, hot tea, and other liquids. This may help thin nasal mucus. It also may help your sinuses drain fluids.  · Heat may help soothe painful areas of your face. Use a towel soaked in hot water. Or, stand in the shower and direct the warm spray onto your face. Using a vaporizer along with a menthol rub at night may also help soothe symptoms.   · An expectorant with guaifenesin may help thin nasal mucus and help your sinuses drain fluids.  · You can use an over-the-counter decongestant, unless a similar medicine was prescribed to you. Nasal sprays work the fastest. Use one that contains phenylephrine or oxymetazoline. First blow your nose gently. Then use the spray. Do not use these medicines more often than directed on the label. If you do, your symptoms may get worse. You may also take pills that contain pseudoephedrine. Don’t use products that combine multiple medicines. This is because side effects may be increased. Read labels. You can also ask the pharmacist for help. (People with high blood pressure should not use decongestants. They can raise blood pressure.)  · Over-the-counter antihistamines may help if allergies contributed to your sinusitis.    · Do not use  nasal rinses or irrigation during an acute sinus infection, unless your healthcare provider tells you to. Rinsing may spread the infection to other areas in your sinuses.  · Use acetaminophen or ibuprofen to control pain, unless another pain medicine was prescribed to you. If you have chronic liver or kidney disease or ever had a stomach ulcer, talk with your healthcare provider before using these medicines. (Aspirin should never be taken by anyone under age 18 who is ill with a fever. It may cause severe liver damage.)  · Don't smoke. This can make symptoms worse.  Follow-up care  Follow up with your healthcare provider or our staff if you are not better in 1 week.  When to seek medical advice  Call your healthcare provider if any of these occur:  · Facial pain or headache that gets worse  · Stiff neck  · Unusual drowsiness or confusion  · Swelling of your forehead or eyelids  · Vision problems, such as blurred or double vision  · Fever of 100.4ºF (38ºC) or higher, or as directed by your healthcare provider  · Seizure  · Breathing problems  · Symptoms don't go away in 10 days  Prevention  Here are steps you can take to help prevent an infection:  · Keep good hand washing habits.  · Don’t have close contact with people who have sore throats, colds, or other upper respiratory infections.  · Don’t smoke, and stay away from secondhand smoke.  · Stay up to date with of your vaccines.  [employer] last reviewed this educational content   on 03/11/2016  © 2000-2019 [employer]. [address], [place] [address]. All rights reserved. This information is not intended as a substitute for professional medical care. Always follow your healthcare professional's instructions.

## 2018-06-16 NOTE — Progress Notes (Signed)
Subjective:    Patient ID: Leah Larson is a 23 y.o. female.    URI    This is a new problem. Episode onset: 10 DAYS. The problem has been unchanged. There has been no fever. Associated symptoms include congestion, coughing, headaches, rhinorrhea and sinus pain. Pertinent negatives include no abdominal pain, chest pain, diarrhea, dysuria, ear pain, joint pain, joint swelling, nausea, neck pain, plugged ear sensation, rash, sore throat, swollen glands, vomiting or wheezing.       The following portions of the patient's history were reviewed and updated as appropriate: allergies, current medications, past family history, past medical history, past social history, past surgical history and problem list.    Review of Systems   Constitutional: Negative for activity change, appetite change, chills, diaphoresis, fatigue, fever and unexpected weight change.   HENT: Positive for congestion, rhinorrhea, sinus pressure and sinus pain. Negative for ear pain and sore throat.    Respiratory: Positive for cough. Negative for chest tightness and wheezing.    Cardiovascular: Negative for chest pain.   Gastrointestinal: Negative for abdominal pain, diarrhea, nausea and vomiting.   Genitourinary: Negative for decreased urine volume and dysuria.   Musculoskeletal: Negative for joint pain and neck pain.   Skin: Negative for rash.   Neurological: Positive for headaches. Negative for dizziness.         Objective:    BP 126/71    Pulse 79    Temp 97.3 F (36.3 C) (Oral)    Resp 20    Ht 1.676 m (5\' 6" )    Wt 61.2 kg (135 lb)    LMP 06/15/2018    BMI 21.79 kg/m     Physical Exam  Constitutional:       General: She is not in acute distress.     Appearance: Normal appearance. She is well-developed. She is not diaphoretic.   HENT:      Head: Normocephalic and atraumatic.      Right Ear: Tympanic membrane and external ear normal.      Left Ear: External ear normal.      Nose: Congestion present.      Right Turbinates: Enlarged.       Left Turbinates: Enlarged.      Right Sinus: Maxillary sinus tenderness present.      Left Sinus: Maxillary sinus tenderness present.      Mouth/Throat:      Mouth: Mucous membranes are moist.      Pharynx: No oropharyngeal exudate.   Eyes:      General:         Right eye: No discharge.         Left eye: No discharge.      Conjunctiva/sclera: Conjunctivae normal.      Pupils: Pupils are equal, round, and reactive to light.   Neck:      Musculoskeletal: Normal range of motion and neck supple.   Cardiovascular:      Rate and Rhythm: Normal rate and regular rhythm.      Heart sounds: Normal heart sounds. No murmur.   Pulmonary:      Effort: Pulmonary effort is normal. No respiratory distress.      Breath sounds: Normal breath sounds. No wheezing or rales.   Lymphadenopathy:      Cervical: No cervical adenopathy.   Skin:     General: Skin is warm.      Findings: No rash.   Neurological:      Mental Status: She is  alert and oriented to person, place, and time.           Assessment and Plan:       Leah Larson was seen today for sinus problem.    Diagnoses and all orders for this visit:    Acute maxillary sinusitis, recurrence not specified  -     fluticasone (FLONASE) 50 MCG/ACT nasal spray; 2 sprays by Nasal route daily  -     amoxicillin-clavulanate (AUGMENTIN) 875-125 MG per tablet; Take 1 tablet by mouth 2 (two) times daily for 7 days        Advised plenty of liquids,tylenol/ibuprofen PRN for pain/fever.F/U with PCP/urgent care soon if symptoms persists/gets worse more than 2-3 days.  Advise to watch for worsening symptoms,with those seek medical help immediatly  F/u with PCP  F/u PRN    Etheleen Sia, MD  [facility]  06/16/2018  7:52 PM

## 2018-06-19 ENCOUNTER — Telehealth (INDEPENDENT_AMBULATORY_CARE_PROVIDER_SITE_OTHER): Payer: Self-pay

## 2018-06-19 NOTE — Telephone Encounter (Signed)
Called to check on patient after recent visit. Left a message to call back if any questions or concerns.

## 2018-06-29 ENCOUNTER — Ambulatory Visit (INDEPENDENT_AMBULATORY_CARE_PROVIDER_SITE_OTHER): Payer: BC Managed Care – PPO | Admitting: Physician Assistant

## 2018-06-29 ENCOUNTER — Encounter (INDEPENDENT_AMBULATORY_CARE_PROVIDER_SITE_OTHER): Payer: Self-pay | Admitting: Physician Assistant

## 2018-06-29 VITALS — BP 124/82 | HR 87 | Temp 98.2°F | Resp 16 | Ht 66.0 in | Wt 130.0 lb

## 2018-06-29 DIAGNOSIS — H1033 Unspecified acute conjunctivitis, bilateral: Secondary | ICD-10-CM

## 2018-06-29 MED ORDER — OFLOXACIN 0.3 % OP SOLN
1.00 [drp] | Freq: Four times a day (QID) | OPHTHALMIC | 0 refills | Status: AC
Start: 2018-06-29 — End: 2018-07-06

## 2018-06-29 NOTE — Progress Notes (Signed)
Subjective:    Patient ID: Leah Larson is a 23 y.o. female.    Pt presents w/ c/o bilat eye discharge and redness   Symptoms have been present since Monday     Pt sates that she woke this morning and had yellow discharge that was crusted  Symptoms began in the left eye and spread to the right eye this morning     Pt was treated for a sinus infection last week       Eye Problem    Both eyes are affected.This is a new problem. The current episode started in the past 7 days. The problem occurs constantly. The problem has been gradually worsening. The injury mechanism was contact lenses. The pain is at a severity of 1/10. The pain is mild. Associated symptoms include an eye discharge, eye redness, a foreign body sensation (sandy/gritty sensation ), itching and a recent URI. Pertinent negatives include no blurred vision, double vision, fever, nausea, photophobia or vomiting. She has tried eye drops for the symptoms. The treatment provided no relief.       The following portions of the patient's history were reviewed and updated as appropriate: allergies, current medications, past family history, past medical history, past social history, past surgical history and problem list.    Review of Systems   Constitutional: Positive for activity change. Negative for chills, fatigue and fever.   Eyes: Positive for discharge, redness and itching. Negative for blurred vision, double vision, photophobia, pain and visual disturbance.   Gastrointestinal: Negative for abdominal pain, nausea and vomiting.   Skin: Negative for rash.   Allergic/Immunologic: Negative for environmental allergies and food allergies.         Objective:    BP 124/82    Pulse 87    Temp 98.2 F (36.8 C) (Oral)    Resp 16    Ht 1.676 m (5\' 6" )    Wt 59 kg (130 lb)    LMP 06/15/2018    BMI 20.98 kg/m     Physical Exam  Vitals signs and nursing note reviewed.   Constitutional:       General: She is not in acute distress.     Appearance: Normal  appearance. She is well-developed. She is not diaphoretic.   HENT:      Head: Normocephalic.      Right Ear: Tympanic membrane and ear canal normal.      Left Ear: Tympanic membrane and ear canal normal.      Nose: Nose normal.      Mouth/Throat:      Mouth: Mucous membranes are moist.      Pharynx: No posterior oropharyngeal erythema.   Eyes:      General: Lids are normal. Lids are everted, no foreign bodies appreciated. Vision grossly intact. Gaze aligned appropriately. No allergic shiner, visual field deficit or scleral icterus.        Right eye: Discharge present. No foreign body or hordeolum.         Left eye: Discharge present.No foreign body or hordeolum.      Extraocular Movements: Extraocular movements intact.      Conjunctiva/sclera:      Right eye: Right conjunctiva is injected.      Left eye: Left conjunctiva is injected.      Pupils: Pupils are equal, round, and reactive to light.   Cardiovascular:      Rate and Rhythm: Normal rate and regular rhythm.      Heart sounds: Normal  heart sounds.   Pulmonary:      Effort: Pulmonary effort is normal.   Lymphadenopathy:      Cervical: No cervical adenopathy.   Skin:     General: Skin is warm and dry.   Neurological:      Mental Status: She is alert and oriented to person, place, and time.           Assessment and Plan:       Rokhaya was seen today for eye problem.    Diagnoses and all orders for this visit:    Acute bacterial conjunctivitis of both eyes  -     ofloxacin (OCUFLOX) 0.3 % ophthalmic solution; Place 1 drop into the left eye 4 (four) times daily for 7 days    Change eye make-up to prevent re-infection.  Do not re-use current pair of contacts. Use new pair of contacts starting 2-3 days after symptoms resolve.  Use cold compresses as needed for pain relief.  Can use an anti-inflammatory if not contraindicated (naproxen or advil) and/or lubricant eye drops (non-medicated) as needed throughout the day.    Practice good hand hygiene with washing hands  with soap any time.Try to avoid touching either eye as can spread the infection to other eye.    Follow-up with doctor in 4-5 days if no improvement in symptoms. If symptoms worsen in any way, go to the ER or follow-up with eye doctor immediately.     Patient/guardian voiced understanding and had no other questions.    Follow up with PCP or RTC if there are any new or worsening symptoms or if the symptoms are lasting longer than expected.  Patient/guardian expressed understanding and agreement with plan of care at time of discharge.        Patient Instructions     Conjunctivitis, Nonspecific    The membrane that coversthe white part ofyour eye (the conjunctiva) is inflamed. Inflammation happens when your body responds to an injury, allergic reaction, infection, or illness. Symptoms of inflammation in the eye may include redness, irritation, itching, swelling, or burning. These symptoms should go away within the next 24 hours. Conjunctivitis may be related to a particle that was in your eye. If so, itmay washout with your tears or irrigation treatment. Being exposed to liquid chemicals or fumes may also cause this reaction.  Home care   Apply a cold pack over the eye for 20 minutes at a time. This will reduce pain. To make a cold pack, put ice cubes in a plastic bag that seals at the top. Wrap the bag in a clean, thin towel or cloth.   Artificial tears may be prescribed to reduce irritation or redness. These should be used 3 to 4 times a day.   You may use acetaminophen or ibuprofen to control pain, unless another medicine was prescribed. (Note: If you have chronic liver or kidney disease, or if you have ever had a stomach ulcer or gastrointestinal bleeding, talk with your healthcare provider before using these medicines.)  Follow-up care  Follow up with your healthcare provider, or as advised.  When to seek medical advice  Call your healthcare provider right away if any of these occur:   Increased eyelid  swelling   Increased eye pain   Increased redness or drainage from the eye   Increased blurry vision or increased sensitivity to light   Failure of normal vision to return within 24 to 48 hours  StayWell last reviewed this educational content on  11/09/2015   2000-2019 [employer], [place]. [address], [place], [place] [address]. All rights reserved. This information is not intended as a substitute for professional medical care. Always follow your healthcare professional's instructions.            Grier Mitts, PA  [facility]  06/29/2018  7:50 PM

## 2018-06-29 NOTE — Patient Instructions (Signed)
Conjunctivitis, Nonspecific    The membrane that coversthe white part ofyour eye (the conjunctiva) is inflamed. Inflammation happens when your body responds to an injury, allergic reaction, infection, or illness. Symptoms of inflammation in the eye may include redness, irritation, itching, swelling, or burning. These symptoms should go away within the next 24 hours. Conjunctivitis may be related to a particle that was in your eye. If so, itmay washout with your tears or irrigation treatment. Being exposed to liquid chemicals or fumes may also cause this reaction.  Home care   Apply a cold pack over the eye for 20 minutes at a time. This will reduce pain. To make a cold pack, put ice cubes in a plastic bag that seals at the top. Wrap the bag in a clean, thin towel or cloth.   Artificial tears may be prescribed to reduce irritation or redness. These should be used 3 to 4 times a day.   You may use acetaminophen or ibuprofen to control pain, unless another medicine was prescribed. (Note: If you have chronic liver or kidney disease, or if you have ever had a stomach ulcer or gastrointestinal bleeding, talk with your healthcare provider before using these medicines.)  Follow-up care  Follow up with your healthcare provider, or as advised.  When to seek medical advice  Call your healthcare provider right away if any of these occur:   Increased eyelid swelling   Increased eye pain   Increased redness or drainage from the eye   Increased blurry vision or increased sensitivity to light   Failure of normal vision to return within 24 to 48 hours  StayWell last reviewed this educational content on 11/09/2015   2000-2019 [employer] [address], [place] [address]. All rights reserved. This information is not intended as a substitute for professional medical care. Always follow your healthcare professional's instructions.

## 2018-07-02 ENCOUNTER — Telehealth (INDEPENDENT_AMBULATORY_CARE_PROVIDER_SITE_OTHER): Payer: Self-pay

## 2018-07-02 NOTE — Telephone Encounter (Signed)
Called to check on patient after recent visit. Left a message to call back if any questions or concerns.

## 2018-12-19 ENCOUNTER — Encounter (INDEPENDENT_AMBULATORY_CARE_PROVIDER_SITE_OTHER): Payer: Self-pay | Admitting: Family Medicine

## 2018-12-19 ENCOUNTER — Ambulatory Visit (INDEPENDENT_AMBULATORY_CARE_PROVIDER_SITE_OTHER): Payer: BC Managed Care – PPO | Admitting: Family Medicine

## 2018-12-19 VITALS — BP 110/69 | HR 92 | Temp 98.0°F | Resp 16 | Ht 66.0 in | Wt 135.0 lb

## 2018-12-19 DIAGNOSIS — L258 Unspecified contact dermatitis due to other agents: Secondary | ICD-10-CM

## 2018-12-19 DIAGNOSIS — H02849 Edema of unspecified eye, unspecified eyelid: Secondary | ICD-10-CM

## 2018-12-19 MED ORDER — PREDNISONE 20 MG PO TABS
40.00 mg | ORAL_TABLET | Freq: Every day | ORAL | 0 refills | Status: AC
Start: 2018-12-19 — End: 2018-12-22

## 2018-12-19 NOTE — Progress Notes (Signed)
Subjective:    Patient ID: Leah Larson is a 23 y.o. female.  Used mask by patient and provider,(surgical)  Used new maskara and noticed redness/itching/swelling of b/l upper eyelid    Eye Problem    Both eyes are affected.This is a new problem. Episode onset: few days. The problem occurs constantly. The problem has been unchanged. There was no injury mechanism. The patient is experiencing no pain. There is no known exposure to pink eye. She wears contacts. Associated symptoms include itching. Pertinent negatives include no blurred vision, eye discharge, double vision, eye redness, fever, foreign body sensation, nausea, photophobia, recent URI or vomiting.       The following portions of the patient's history were reviewed and updated as appropriate: allergies, current medications, past family history, past medical history, past social history, past surgical history and problem list.    Review of Systems   Constitutional: Negative for activity change, appetite change, chills, diaphoresis and fever.   HENT: Negative for congestion.    Eyes: Positive for itching. Negative for blurred vision, double vision, photophobia, pain, discharge, redness and visual disturbance.   Respiratory: Negative for apnea and cough.    Gastrointestinal: Negative for abdominal pain, nausea and vomiting.   Genitourinary: Negative for decreased urine volume and menstrual problem.        LMP-recent, denies being pregnant   Musculoskeletal: Negative for arthralgias, joint swelling and myalgias.   Skin: Positive for rash.   Neurological: Negative for dizziness.         Objective:    BP 110/69    Pulse 92    Temp 98 F (36.7 C) (Oral)    Resp 16    Ht 1.676 m (5\' 6" )    Wt 61.2 kg (135 lb)    LMP 12/01/2018 (Approximate)    BMI 21.79 kg/m     Physical Exam  Constitutional:       General: She is not in acute distress.     Appearance: Normal appearance. She is not ill-appearing.   HENT:      Head: Normocephalic and atraumatic.   Eyes:       General:         Right eye: No discharge.         Left eye: No discharge.      Extraocular Movements: Extraocular movements intact.      Conjunctiva/sclera: Conjunctivae normal.      Right eye: Right conjunctiva is not injected. No exudate.     Left eye: Left conjunctiva is not injected. No exudate.     Pupils: Pupils are equal, round, and reactive to light.      Comments: Mild erythema of b/l eyelid upper, non tender   Cardiovascular:      Rate and Rhythm: Normal rate and regular rhythm.   Pulmonary:      Effort: Pulmonary effort is normal.      Breath sounds: Normal breath sounds.   Neurological:      Mental Status: She is alert.   Psychiatric:         Mood and Affect: Mood normal.           Assessment and Plan:       Umaima was seen today for eye problem.    Diagnoses and all orders for this visit:    Swelling of eyelid, unspecified laterality  -     predniSONE (DELTASONE) 20 MG tablet; Take 2 tablets (40 mg total) by mouth daily for 3 days  Contact dermatitis due to other agent, unspecified contact dermatitis type  -     predniSONE (DELTASONE) 20 MG tablet; Take 2 tablets (40 mg total) by mouth daily for 3 days        n     Do cold compression locally,benadryl as needed  Prednisone if not better or gets worse  F/u prn  F/u with pcp    Etheleen Sia, MD  [facility]  12/19/2018  8:09 PM

## 2018-12-19 NOTE — Patient Instructions (Signed)
Understanding Contact Dermatitis    Contact dermatitis is a common type of skin rash. It’s caused by something that touches the skin and makes it irritated and inflamed. It can occur on skin on any part of the body, such as the face, neck, hands, arms, and legs. Contact dermatitis is not spread from person to person.  Often, the reaction of contact dermatitis occurs 1 to 2 days after contact with the offending agent.    How to say it  COHN-tact [id number]  What causes contact dermatitis?  It’s caused by something that irritates the skin, or that creates an allergic reaction on the skin. People can get contact dermatitis from many kinds of things. These include:   · Plant oils in poison ivy, oak, and sumac  · Chemicals in household cleaners, solvents, and glue  · Chemicals in makeup, soap, laundry detergent, perfume, acne cream, and hair products  · Certain medicines, such as neomycin, bacitracin, benzocaine, and thimerosal  · Metals such as nickel, found in some jewelry and watch bands   · The sticky material on the back of bandages and tape (adhesive)  · Things that can cause tiny breaks in the skin, such as wood, fiberglass, metal tools, and plant thorns  · Rubber latex in surgical gloves and other medical supplies  Dermatitis can also be caused by the skin being damp for long periods of time. This can happen from washing your hands too often, or working with wet materials.   Symptoms of contact dermatitis  Symptoms can include skin that is:  · Blistered  · Burning  · Cracked  · Dry  · Itchy  · Painful  · Red  · Rough, thickened, and leathery  · Swollen  · Warm  The blisters may ooze fluid and form crusts.  Treatment for contact dermatitis  Treatment is done to help relieve itching and reduce inflammation. The rash should go away in a few days to a few weeks. Treatments include:   · Cool, moist compress. Use a clean damp cloth. Put it on the area for 20 to 30 minutes, 5 to 6 times a day for the first 3 days.   · Steroid cream or ointment. You can apply this medicine several times a day on clean skin.  · Oral corticosteroid. Your healthcare provider may prescribe this medicine if you have severe skin symptoms on a large part of your body.  Your healthcare provider may give you a steroid injection instead of pills.  · Oral antihistamine. This medicine can help reduce itching.  · Colloidal oatmeal bath. Soaking in water with colloidal oatmeal can help soothe skin.  · Plain cream, lotion, or ointment. Cream, lotion, or ointment without medicine can help to soothe and protect your skin.  Living with contact dermatitis  Talk with your healthcare provider about what may have caused your contact dermatitis. Patch testing may help you figure out what caused the rash so you can avoid further contact with it. Once you learn what caused your rash, make sure to avoid that substance. If your skin comes into contact with it again, make sure to wash your skin right away. If you can’t avoid the substance, wear gloves or other protective clothing before you touch it. Wash the clothing you were wearing. This is because the substance you are allergic to may stay on them until it's washed off. Pets can also pass on allergens such as poison ivy from the plant to your skin. Bathe pets if you suspect they have   been in contact. Or use a cream, lotion, or ointment to protect your skin.   When to call your healthcare provider  Call your healthcare provider right away if you have any of these:  · Fever of 100.4°F (38°C) or higher, or as directed by your healthcare provider  · Symptoms that don’t get better, or get worse  · New symptoms  [employer]Well last reviewed this educational content on 10/09/2017  © 2000-2020 [employer]. [address], [place] [address]. All rights reserved. This information is not intended as a substitute for professional medical care. Always follow your healthcare professional's instructions.

## 2018-12-22 ENCOUNTER — Telehealth (INDEPENDENT_AMBULATORY_CARE_PROVIDER_SITE_OTHER): Payer: Self-pay

## 2018-12-22 NOTE — Telephone Encounter (Signed)
Called to check on patient after recent visit. Left a message to call back if any questions or concerns.

## 2019-01-23 ENCOUNTER — Encounter (INDEPENDENT_AMBULATORY_CARE_PROVIDER_SITE_OTHER): Payer: Self-pay

## 2019-01-23 ENCOUNTER — Ambulatory Visit (INDEPENDENT_AMBULATORY_CARE_PROVIDER_SITE_OTHER): Payer: BC Managed Care – PPO | Admitting: Physician Assistant

## 2019-01-23 VITALS — BP 118/81 | HR 82 | Temp 97.6°F | Resp 12 | Ht 66.0 in | Wt 135.0 lb

## 2019-01-23 DIAGNOSIS — R220 Localized swelling, mass and lump, head: Secondary | ICD-10-CM

## 2019-01-23 DIAGNOSIS — R21 Rash and other nonspecific skin eruption: Secondary | ICD-10-CM

## 2019-01-23 MED ORDER — PREDNISONE 20 MG PO TABS
ORAL_TABLET | ORAL | 0 refills | Status: DC
Start: 2019-01-23 — End: 2019-01-23

## 2019-01-23 MED ORDER — HYDROCORTISONE 2.5 % EX CREA
TOPICAL_CREAM | Freq: Two times a day (BID) | CUTANEOUS | 0 refills | Status: AC
Start: 2019-01-23 — End: 2019-01-29

## 2019-01-23 MED ORDER — PREDNISONE 20 MG PO TABS
ORAL_TABLET | ORAL | 0 refills | Status: AC
Start: 2019-01-23 — End: ?

## 2019-01-23 MED ORDER — HYDROCORTISONE 2.5 % EX CREA
TOPICAL_CREAM | Freq: Two times a day (BID) | CUTANEOUS | 0 refills | Status: DC
Start: 2019-01-23 — End: 2019-01-23

## 2019-01-23 NOTE — Patient Instructions (Signed)
Rash, Nonspecific    You have been seen today for a rash.    The rash does not have a specific appearance or cause that would let the medical staff give a definite diagnosis or treatment now.    There are many causes for rashes to appear on the skin. The medical staff will have talked about some of the many causes. Some causes may be: Allergic reactions, chemical irritation of the skin or infections. A rash alone with no other symptoms is rarely harmful.    If you have some itching you may try an oral (by mouth) antihistamine like diphenhydramine (Benadryl). This is available over the counter (without prescription). Follow the directions and precautions on the medicine packaging.    Follow up with your family doctor or clinic for reevaluation of the rash if it does not clear up in two or three days.    Sometimes, you will need to see a Dermatologist (a skin specialist doctor) for help finding the cause of the rash.    It is OK for you to go home now.    Even though it may be hard, try not to scratch the affected area. A cool wet cloth can help relieve itching and keep you from scratching.    YOU SHOULD SEEK MEDICAL ATTENTION IMMEDIATELY, EITHER HERE OR AT THE NEAREST EMERGENCY DEPARTMENT, IF ANY OF THE FOLLOWING OCCURS:   Rash spreads or gets worse.   Severe itching, pain or burning in the skin.   Blistering, bruising or bleeding skin.   Fever (temperature higher than 100.4F / 38C), headache, vomiting (throwing up).   Any new symptoms which are of concern to you.   Any signs of infection in the skin develop. This includes more redness, pain, pus drainage, fevers (temperature higher than 100.4F / 38C) or swelling.

## 2019-01-23 NOTE — Progress Notes (Signed)
Subjective:    Patient ID: Leah Larson is a 23 y.o. female.  Proper PPE including N95 mask and gloves were on.    23 year old female patient was seen today in the clinic complaining of swelling of the face and itchy rash on the face.  Reports she went to hike yesterday and got some insect bite on her face.  Denies any chest pain, difficulty breathing, palpitation, shortness of breathing, wheezing, dizziness, weakness on either side of the body, facial drooping, slurring of speech, nausea, vomiting, diarrhea, constipation, abdominal pain, blood in urine, blood in stool.   No other complaints today.      Rash   This is a new problem. The current episode started yesterday. The problem is unchanged. The affected locations include the face. The rash is characterized by redness and itchiness. She was exposed to an insect bite/sting. Pertinent negatives include no anorexia, congestion, cough, diarrhea, eye pain, facial edema, fatigue, fever, joint pain, nail changes, rhinorrhea, shortness of breath, sore throat or vomiting. Past treatments include nothing. The treatment provided no relief. There is no history of allergies, asthma, eczema or varicella.       The following portions of the patient's history were reviewed and updated as appropriate: allergies, current medications, past family history, past medical history, past social history, past surgical history and problem list.    Review of Systems   Constitutional: Negative for activity change, appetite change, chills, fatigue and fever.   HENT: Positive for facial swelling. Negative for congestion, rhinorrhea and sore throat.    Eyes: Negative.  Negative for pain.   Respiratory: Negative.  Negative for cough and shortness of breath.    Cardiovascular: Negative for chest pain, palpitations and leg swelling.   Gastrointestinal: Negative for abdominal pain, anorexia, blood in stool, constipation, diarrhea, nausea and vomiting.   Genitourinary: Negative for  hematuria.   Musculoskeletal: Negative for arthralgias, back pain, gait problem, joint pain, joint swelling, myalgias, neck pain and neck stiffness.   Skin: Positive for rash. Negative for nail changes.   Neurological: Negative for seizures, weakness, light-headedness, numbness and headaches.   Hematological: Negative for adenopathy.   Psychiatric/Behavioral: Negative for agitation, behavioral problems and confusion.         Objective:    BP 118/81    Pulse 82    Temp 97.6 F (36.4 C) (Temporal)    Resp 12    Ht 1.676 m (5\' 6" )    Wt 61.2 kg (135 lb)    LMP 12/24/2018    BMI 21.79 kg/m     Physical Exam  Vitals signs reviewed.   Constitutional:       General: She is not in acute distress.  HENT:      Head: Normocephalic and atraumatic.   Eyes:      Conjunctiva/sclera: Conjunctivae normal.      Pupils: Pupils are equal, round, and reactive to light.   Neck:      Musculoskeletal: Normal range of motion and neck supple. No neck rigidity or muscular tenderness.   Cardiovascular:      Rate and Rhythm: Normal rate and regular rhythm.      Pulses: Normal pulses.      Heart sounds: Normal heart sounds. No murmur. No friction rub. No gallop.    Pulmonary:      Effort: Pulmonary effort is normal. No respiratory distress.      Breath sounds: Normal breath sounds. No stridor. No wheezing, rhonchi or rales.   Chest:  Chest wall: No tenderness.   Musculoskeletal: Normal range of motion.   Lymphadenopathy:      Cervical: No cervical adenopathy.   Skin:     General: Skin is warm and dry.      Capillary Refill: Capillary refill takes less than 2 seconds.      Findings: Rash present. Rash is macular.             Comments: Facial swelling with macular rash   Neurological:      General: No focal deficit present.      Mental Status: She is alert and oriented to person, place, and time. Mental status is at baseline.   Psychiatric:         Mood and Affect: Mood normal.         Behavior: Behavior normal.         Thought Content:  Thought content normal.         Judgment: Judgment normal.           Assessment and Plan:       1. Swelling of face    - predniSONE (DELTASONE) 20 MG tablet; 3 tab po qd x 2 days, 2 tab po qd x 2 days, 1 tab po qd x 2 days.  Dispense: 12 tablet; Refill: 0    2. Rash and nonspecific skin eruption    - hydrocortisone 2.5 % cream; Apply topically 2 (two) times daily for 6 days  Dispense: 28 g; Refill: 0  - predniSONE (DELTASONE) 20 MG tablet; 3 tab po qd x 2 days, 2 tab po qd x 2 days, 1 tab po qd x 2 days.  Dispense: 12 tablet; Refill: 0    Advised patient to take medication as prescribed.  Advised patient to keep the area clean and dry.  Advised to take Tylenol or ibuprofen as needed for the pain if any.  Advised patient to call 911 or go to the ER if there is any increasing swelling, increasing tenderness, increasing redness or any discharge from the site, any chest pain, difficulty breathing, palpitation, shortness of breathing, wheezing, dizziness, weakness on either side of the body, facial drooping, slurring of speech, fever or chills greater than 102 F, nausea, vomiting, diarrhea, constipation, abdominal pain, blood in urine, blood in stool.  Patient verbalized understanding.            Jearld Fenton, [place]  [facility]  01/23/2019  7:38 PM

## 2019-01-24 ENCOUNTER — Ambulatory Visit (INDEPENDENT_AMBULATORY_CARE_PROVIDER_SITE_OTHER): Payer: BC Managed Care – PPO | Admitting: Physician Assistant

## 2019-01-24 ENCOUNTER — Encounter (INDEPENDENT_AMBULATORY_CARE_PROVIDER_SITE_OTHER): Payer: Self-pay

## 2019-01-24 ENCOUNTER — Other Ambulatory Visit
Admission: RE | Admit: 2019-01-24 | Discharge: 2019-01-24 | Disposition: A | Discharge status: Home / Self Care | Payer: BC Managed Care – PPO | Source: Ambulatory Visit | Attending: Physician Assistant | Admitting: Physician Assistant

## 2019-01-24 VITALS — BP 122/86 | HR 90 | Temp 97.8°F | Resp 18 | Ht 66.0 in | Wt 135.0 lb

## 2019-01-24 DIAGNOSIS — R5383 Other fatigue: Secondary | ICD-10-CM

## 2019-01-24 DIAGNOSIS — R590 Localized enlarged lymph nodes: Secondary | ICD-10-CM

## 2019-01-24 LAB — POCT INFECTIOUS MONONUCLEOSIS ANTIBODY: POCT Infectious Mono Heterophile Antibodies: NEGATIVE

## 2019-01-24 LAB — VH UCC CBC POCT
VH UCC # GR, POCT: 4.8 10*9/L (ref 1.2–8.0)
VH UCC # LYMPH, POCT: 1.9 10*9/L (ref 0.5–5.0)
VH UCC # MONO, POCT: 0.5 10*9/L (ref 0.1–1.5)
VH UCC % GR, POCT: 66.8 % (ref 35–80)
VH UCC % LYMPH, POCT: 26.9 % (ref 15–50)
VH UCC % MONO, POCT: 6.3 % (ref 2–15)
VH UCC HCT, POCT: 38.2 % (ref 35.0–55.0)
VH UCC HGB, POCT: 13.3 g/dL (ref 11.5–16.5)
VH UCC MCH, POCT: 30.3 pg (ref 25.0–35.0)
VH UCC MCHC, POCT: 34.7 g/dL (ref 31.0–38.0)
VH UCC MCV, POCT: 87.2 fL (ref 75.0–100)
VH UCC MPV, POCT: 8.3 fL (ref 8–11)
VH UCC PLT, POCT: 209 10*9/L (ref 100–400)
VH UCC RBC, POCT: 4.39 10*12/L (ref 3.5–5.5)
VH UCC RDW, POCT: 11.4 % (ref 11–16)
VH UCC WBC, POCT: 7.2 10*9/L (ref 3.5–10)

## 2019-01-24 NOTE — Progress Notes (Signed)
Date Specimen Drawn 01/24/2019  Time Specimen Drawn: 9:13 AM  Test(s) Ordered: LYME, CBC AND MONO  Patient Tolerance: Good  Location Specimen Drawn: Right antecubital    [facility]ME SPUN AND SENT    [facility] NO

## 2019-01-24 NOTE — Patient Instructions (Signed)
Lymphadenopathy  Lymphadenopathy is swelling of the lymph nodes. Lymph nodes are small, bean-shaped glands around the body.  What are lymph nodes?  Lymph nodes are part of your immune system. These glands are found in your neck, over your clavicle, armpits, groin, chest, and belly (abdomen). They act as filters for lymph fluid as it flows through your body. Lymph fluid contains white blood cells (lymphocytes) that help the body fight infection and disease.  Why lymph nodes swell  Lymphadenopathy is very common. The glands often get larger during a viral or bacterial infection. It can happen during a cold, the flu, or strep throat. The nodes may swell in just one area of the body, such as the neck (localized). Or nodes may swell all over the body (generalized). The neck (cervical) lymph nodes are the most common site of lymphadenopathy.  What causes lymphadenopathy?  Dead cells and fluid build up in the lymph nodes as they help fight infection or disease. This causes them to swell in size. Enlarged lymph nodes are often near the source of infection. This can help to find the cause of an infection. For example, swollen lymph nodes around the jaw may be because of an infection in the teeth or mouth. But lymphadenopathy may also be generalized. This is common in some viral illnesses such as infectious mononucleosis, HIV, or chickenpox (varicella).  Lymphadenopathy can also be caused by:   Infection of a lymph node or small group of nodes (lymphadenitis)   Cancer   Reactions to medicines such as antibiotics and certain blood pressure, gout, and seizure medicines   Other health conditions, such as lupus or sarcoidosis  Symptoms of lymphadenopathy  Lymphadenopathy can cause symptoms such as:   Lumps under the jaw, on the sides or back of the neck, in the armpits, in the groin, or in the chest or belly (abdomen)   Pain or soreness in any of these areas   Redness or warmth in any of these areas  You may also have  symptoms from an infection causing the swollen glands. These symptoms may include fever, sore throat, body aches, or cough.  Diagnosing lymphadenopathy  Your healthcare provider will ask about your health history and symptoms. He or she will give you a physical exam and check the areas where lymph nodes are enlarged. Your provider will check the size, texture, and location of the nodes. He or she will ask how long they have been swollen and if they are painful. You may be advised to have diagnostic tests and referral to specialists may be advised. The tests may include:   Blood tests. These are done to check for signs of infection and other problems.   Urine test. This is also done to check for infection and other problems.   Chest X-ray, ultrasound, CT scan, or MRI scans. These tests can show enlarged lymph nodes or other problems.   Lymph node biopsy. The cause of enlarged lymph nodes may be checked with a biopsy. Small samples of lymph node tissue are taken and checked in a lab for signs of infection, cancer, and other causes. You may be referred to other specialists for their opinion as well.  Treatment for lymphadenopathy  The treatment of enlarged lymph nodes depends on the cause. Enlarged lymph nodes are often harmless and go away without any treatment. Treatment is most often done on the cause of the enlarged nodes and may include:   Antibiotic or antiviral medicine to treat a bacterial or   viral infection   Incision and drainage of a lymph node for lymphadenitis   Other medicines or procedures to treat the cause of the enlarged nodes  You may need a follow-up exam in 3 to 4 weeks to recheck enlarged nodes.  When to call your healthcare provider  Call your healthcare provider if:   Your symptoms get worse   You have new symptoms   You have a fever of 100.4F (38C) or higher, or as directed by your provider   Your lymph nodes that are still swollen after 3 to 4 weeks    [employer] last reviewed this  educational content on 10/09/2017   2000-2020 [employer] [address], [place] [address]. All rights reserved. This information is not intended as a substitute for professional medical care. Always follow your healthcare professional's instructions.

## 2019-01-24 NOTE — Progress Notes (Signed)
Subjective:    Patient ID: Leah Larson is a 23 y.o. female.  Proper PPE including N95 mask and gloves were on.    23 year old female patient was seen today in the clinic follow-up from her last visit today .  Patient was seen yesterday for swelling of the face and itchy rash on the face.  Patient reports she is concerned regarding Lyme disease since she had a headache 2 days ago.  Also complaining of fatigue.  Denies sore throat abdominal pain.  Patient reports " I want to check for all infection"  Patient was started on topical hydrocortisone and oral prednisone.  Patient tried the hydrocortisone but she noticed some rash on her right external ear.  She has not tried the oral prednisone yet.  Patient has taken oral prednisone in the past without any reaction.  Denies any chest pain, difficulty breathing, palpitation, shortness of breathing, wheezing, dizziness, weakness on either side of the body, facial drooping, slurring of speech, nausea, vomiting, diarrhea, constipation, abdominal pain, blood in urine, blood in stool.   No other complaints today.        The following portions of the patient's history were reviewed and updated as appropriate: allergies, current medications, past family history, past medical history, past social history, past surgical history and problem list.    Review of Systems   Constitutional: Positive for fatigue. Negative for activity change, appetite change and chills.   HENT: Positive for facial swelling.    Eyes: Negative.    Respiratory: Negative.    Cardiovascular: Negative for chest pain, palpitations and leg swelling.   Gastrointestinal: Negative for abdominal pain, blood in stool, constipation and nausea.   Genitourinary: Negative for hematuria.   Musculoskeletal: Negative for arthralgias, back pain, gait problem, joint swelling, myalgias, neck pain and neck stiffness.   Skin: Positive for rash.   Neurological: Negative for seizures, weakness, light-headedness, numbness  and headaches.   Hematological: Positive for adenopathy.   Psychiatric/Behavioral: Negative for agitation, behavioral problems and confusion.         Objective:    BP 122/86    Pulse 90    Temp 97.8 F (36.6 C) (Temporal)    Resp 18    Ht 1.676 m (5\' 6" )    Wt 61.2 kg (135 lb)    BMI 21.79 kg/m     Physical Exam  Vitals signs reviewed.   Constitutional:       General: She is not in acute distress.  HENT:      Head: Normocephalic and atraumatic.      Left Ear: External ear normal. There is impacted cerumen.      Ears:      Comments: Erythema present on the right auricle     Nose: Nose normal. No congestion or rhinorrhea.      Mouth/Throat:      Mouth: Mucous membranes are moist.      Pharynx: Oropharynx is clear. No oropharyngeal exudate or posterior oropharyngeal erythema.   Eyes:      Conjunctiva/sclera: Conjunctivae normal.      Pupils: Pupils are equal, round, and reactive to light.   Neck:      Musculoskeletal: Normal range of motion and neck supple. No neck rigidity or muscular tenderness.   Cardiovascular:      Rate and Rhythm: Normal rate and regular rhythm.      Pulses: Normal pulses.      Heart sounds: Normal heart sounds. No murmur. No friction rub. No gallop.  Pulmonary:      Effort: Pulmonary effort is normal. No respiratory distress.      Breath sounds: Normal breath sounds. No stridor. No wheezing, rhonchi or rales.   Chest:      Chest wall: No tenderness.   Abdominal:      General: Abdomen is flat. Bowel sounds are normal. There is no distension.      Palpations: Abdomen is soft. There is no mass.      Tenderness: There is no abdominal tenderness. There is no right CVA tenderness, left CVA tenderness, guarding or rebound.   Musculoskeletal: Normal range of motion.   Lymphadenopathy:      Cervical: No cervical adenopathy.   Skin:     General: Skin is warm and dry.      Capillary Refill: Capillary refill takes less than 2 seconds.      Findings: Rash present. Rash is macular.             Comments:  Facial swelling with macular rash   Neurological:      General: No focal deficit present.      Mental Status: She is alert and oriented to person, place, and time. Mental status is at baseline.   Psychiatric:         Mood and Affect: Mood normal.         Behavior: Behavior normal.         Thought Content: Thought content normal.         Judgment: Judgment normal.           Assessment and Plan:       Darnella was seen today for facial swelling.    Diagnoses and all orders for this visit:    Fatigue, unspecified type  -     POCT Infectious Mononucleosis Antibody  -     Lyme Ab Tot Rflx to WB IGG/IGM; Future    Anterior cervical adenopathy      Results     Procedure Component Value Units Date/Time    POCT UCC CBC [[id number]]  (Normal) Collected:  01/24/19 0910     Updated:  01/24/19 0917     [facility] UCC WBC, POCT 7.2 x 10^9/L      [facility] UCC % LYMPH, POCT 26.9 %      [facility] UCC % MONO, POCT 6.3 %      [facility] UCC % GR, POCT 66.8 %      [facility] UCC # LYMPH, POCT 1.9 x 10^9/L      [facility] UCC # MONO, POCT 0.5 x 10^9/L      [facility] UCC # GR, POCT 4.8 x 10^9/L      [facility] UCC RBC, POCT 4.39 x 10^12/L      [facility] UCC HGB, POCT 13.3 g/dL      [facility] UCC HCT, POCT 40.9 %      [facility] UCC MCV, POCT 87.2 fL      [facility] UCC MCH, POCT 30.3 pg      [facility] UCC MCHC, POCT 34.7 g/dL      [facility] UCC RDW, POCT 81.1 %      [facility] UCC PLT, POCT 209 x 10^9/L      [facility] UCC MPV, POCT 8.3 fL     POCT Infectious Mononucleosis Antibody [[id number]]  (Normal) Collected:  01/24/19 0910     Updated:  01/24/19 0916     POCT QC Pass     POCT Infectious Mono Heterophile Antibodies Negative  Discussed and provided lab results with the patient.  Lab results did not show any infection, sending blood work for Lyme.  Advised patient that if Lyme work-up comes back positive then will send antibiotic.  Patient verbalized understanding.  This patient to stop taking the topical hydrocortisone and start taking the oral prednisone.  Patient has taken the oral prednisone in the past without any side effects.  Discussed  possible side effects of prednisone again, patient verbalized understanding.  Follow-up if not better.  Advised patient to call 911 or go to the ER if there is any chest pain, difficulty breathing, palpitation, shortness of breathing, wheezing, dizziness, weakness on either side of the body, facial drooping, slurring of speech, fever or chills greater than 102 F, nausea, vomiting, diarrhea, constipation, abdominal pain, blood in urine, blood in stool.  Patient verbalized understanding.          Jearld Fenton, [place]  [facility]  01/24/2019  8:55 AM

## 2019-01-25 LAB — LYME AB, TOTAL,REFLEX TO WESTERN BLOT (IGG & IGM): Lyme AB: NONREACTIVE

## 2019-01-26 ENCOUNTER — Telehealth (INDEPENDENT_AMBULATORY_CARE_PROVIDER_SITE_OTHER): Payer: Self-pay

## 2019-01-26 NOTE — Telephone Encounter (Signed)
Called to check on patient after recent visit. Left a message to call back if any questions or concerns.

## 2019-01-27 ENCOUNTER — Telehealth (INDEPENDENT_AMBULATORY_CARE_PROVIDER_SITE_OTHER): Payer: Self-pay

## 2019-01-27 NOTE — Telephone Encounter (Signed)
Called to check on patient after recent visit. Unable to leave a message.
